# Patient Record
Sex: Male | Born: 1990 | Race: Black or African American | Hispanic: No | Smoking: Never smoker
Health system: Southern US, Community
[De-identification: ages and names within clinical notes are randomized; demographics above are authoritative.]

---

## 2015-10-04 ENCOUNTER — Encounter (HOSPITAL_COMMUNITY): Payer: Self-pay | Admitting: *Deleted

## 2015-10-04 ENCOUNTER — Emergency Department (HOSPITAL_COMMUNITY): Payer: Self-pay

## 2015-10-04 DIAGNOSIS — S93401A Sprain of unspecified ligament of right ankle, initial encounter: Secondary | ICD-10-CM | POA: Insufficient documentation

## 2015-10-04 DIAGNOSIS — Y929 Unspecified place or not applicable: Secondary | ICD-10-CM | POA: Insufficient documentation

## 2015-10-04 DIAGNOSIS — Y9367 Activity, basketball: Secondary | ICD-10-CM | POA: Insufficient documentation

## 2015-10-04 DIAGNOSIS — X58XXXA Exposure to other specified factors, initial encounter: Secondary | ICD-10-CM | POA: Insufficient documentation

## 2015-10-04 DIAGNOSIS — Y999 Unspecified external cause status: Secondary | ICD-10-CM | POA: Insufficient documentation

## 2015-10-04 NOTE — ED Notes (Signed)
Pt c/o pain to right ankle; pt states he was playing basketball x 1 week ago and rolled onto his ankle; pt was seen at hospital and diagnosed with a sprain; pt still c/o pain and swelling to right ankle

## 2015-10-05 ENCOUNTER — Emergency Department (HOSPITAL_COMMUNITY)
Admission: EM | Admit: 2015-10-05 | Discharge: 2015-10-05 | Disposition: A | Discharge status: Home / Self Care | Payer: Self-pay | Attending: Emergency Medicine | Admitting: Emergency Medicine

## 2015-10-05 DIAGNOSIS — S93401A Sprain of unspecified ligament of right ankle, initial encounter: Secondary | ICD-10-CM

## 2015-10-05 MED ORDER — DICLOFENAC SODIUM 50 MG PO TBEC: 50.0000 mg | DELAYED_RELEASE_TABLET | Freq: Two times a day (BID) | ORAL | Status: DC

## 2015-10-05 MED ORDER — HYDROCODONE-ACETAMINOPHEN 5-325 MG PO TABS
1.0000 | ORAL_TABLET | Freq: Once | ORAL | Status: AC
  Administered 2015-10-05: 1 via ORAL
  Filled 2015-10-05: qty 1

## 2015-10-05 MED ORDER — HYDROCODONE-ACETAMINOPHEN 5-325 MG PO TABS: 1.0000 | ORAL_TABLET | ORAL | Status: DC | PRN

## 2015-10-05 NOTE — ED Provider Notes (Signed)
CSN: [id number]     Arrival date & time 10/04/15  2313 History   First MD Initiated Contact with Patient 10/05/15 0117     Chief Complaint  Patient presents with  . Ankle Pain     (Consider location/radiation/quality/duration/timing/severity/associated sxs/prior Treatment) Patient is a 25 y.o. male presenting with ankle pain. The history is provided by the patient. No language interpreter was used.  Ankle Pain Location:  Ankle Time since incident:  1 week Injury: yes   Ankle location:  R ankle Pain details:    Quality:  Shooting   Radiates to:  Does not radiate   Severity:  Severe   Onset quality:  Sudden   Timing:  Constant   Progression:  Worsening Chronicity:  New Dislocation: no   Foreign body present:  No foreign bodies Prior injury to area:  No Relieved by:  Nothing  Daniel Waller is a 25 y.o. male who presents to the ED with right ankle pain that started a week ago when he was playing basketball and rolled his ankle. He was evaluated at a hospital in [place], [place] and told he had a sprain and to elevate it. Patient tried to work but stands all day and swelling and pain increased.   History reviewed. No pertinent past medical history. History reviewed. No pertinent past surgical history. History reviewed. No pertinent family history. Social History  Substance Use Topics  . Smoking status: Never Smoker   . Smokeless tobacco: None  . Alcohol Use: No    Review of Systems Negative except as stated in HPI   Allergies  Review of patient's allergies indicates no known allergies.  Home Medications   Prior to Admission medications   Medication Sig Start Date End Date Taking? Authorizing Provider  diclofenac (VOLTAREN) 50 MG EC tablet Take 1 tablet (50 mg total) by mouth 2 (two) times daily. 10/05/15   Jakhia Buxton Orlene Och, NP  HYDROcodone-acetaminophen (NORCO/VICODIN) 5-325 MG tablet Take 1 tablet by mouth every 4 (four) hours as needed. 10/05/15   Zacariah Belue Orlene Och, NP   BP  133/80 mmHg  Pulse 60  Temp(Src) 98.9 F (37.2 C) (Oral)  Resp 20  Ht  (1.905 m)  Wt 102.059 kg  BMI 28.12 kg/m2  SpO2 100% Physical Exam  Constitutional: He is oriented to person, place, and time. He appears well-developed and well-nourished. No distress.  HENT:  Head: Normocephalic.  Eyes: EOM are normal.  Neck: Normal range of motion. Neck supple.  Cardiovascular: Normal rate.   Pulmonary/Chest: Effort normal.  Musculoskeletal:       Right ankle: He exhibits decreased range of motion (due to pain), swelling and ecchymosis. He exhibits no deformity, no laceration and normal pulse. Tenderness. Lateral malleolus tenderness found. Achilles tendon normal.       Feet:  Pedal pulses 2+, adequate circulate circulation. Large amount of swelling to the lateral aspect of the right ankle.   Neurological: He is alert and oriented to person, place, and time. No cranial nerve deficit.  Skin: Skin is warm and dry.  Psychiatric: He has a normal mood and affect. His behavior is normal.  Nursing note and vitals reviewed.   ED Course  Procedures (including critical care time) Labs Review Labs Reviewed - No data to display  Imaging Review Dg Ankle Complete Right  10/05/2015  CLINICAL DATA:  25 year old male with right ankle injury. EXAM: RIGHT ANKLE - COMPLETE 3+ VIEW COMPARISON:  None. FINDINGS: There is no acute fracture or dislocation. The bones  are well mineralized. The ankle mortise is intact. A well corticated small bony fragment inferior to the medial malleolus likely represent an accessory ossicle. There is soft tissue swelling anterior the ankle joint. IMPRESSION: No acute fracture or dislocation. Electronically Signed   By: Elgie CollardArash  Radparvar M.D.   On: 10/05/2015 00:30    MDM  25 y.o. male with pain and swelling to the right ankle s/p injury one week ago. Stable for d/c without fracture or dislocation on x-ray. No focal neuro deficits. Air cast applied, ice, elevation, crutches and  f/u with ortho.   Final diagnoses:  Ankle sprain, right, initial encounter      [facility] M Ruberta Holck, NP 10/05/15 0140  Shon Batonourtney F Horton, MD 10/05/15 [phone]

## 2017-01-27 ENCOUNTER — Emergency Department (HOSPITAL_COMMUNITY): Payer: Self-pay

## 2017-01-27 ENCOUNTER — Emergency Department (HOSPITAL_COMMUNITY)
Admission: EM | Admit: 2017-01-27 | Discharge: 2017-01-27 | Disposition: A | Discharge status: Home / Self Care | Payer: Self-pay | Attending: Emergency Medicine | Admitting: Emergency Medicine

## 2017-01-27 ENCOUNTER — Encounter (HOSPITAL_COMMUNITY): Payer: Self-pay | Admitting: Emergency Medicine

## 2017-01-27 DIAGNOSIS — S82832A Other fracture of upper and lower end of left fibula, initial encounter for closed fracture: Secondary | ICD-10-CM | POA: Insufficient documentation

## 2017-01-27 DIAGNOSIS — X509XXA Other and unspecified overexertion or strenuous movements or postures, initial encounter: Secondary | ICD-10-CM | POA: Insufficient documentation

## 2017-01-27 DIAGNOSIS — Y92009 Unspecified place in unspecified non-institutional (private) residence as the place of occurrence of the external cause: Secondary | ICD-10-CM | POA: Insufficient documentation

## 2017-01-27 DIAGNOSIS — Y999 Unspecified external cause status: Secondary | ICD-10-CM | POA: Insufficient documentation

## 2017-01-27 DIAGNOSIS — Y9367 Activity, basketball: Secondary | ICD-10-CM | POA: Insufficient documentation

## 2017-01-27 DIAGNOSIS — Z79899 Other long term (current) drug therapy: Secondary | ICD-10-CM | POA: Insufficient documentation

## 2017-01-27 DIAGNOSIS — S93402A Sprain of unspecified ligament of left ankle, initial encounter: Secondary | ICD-10-CM | POA: Insufficient documentation

## 2017-01-27 MED ORDER — IBUPROFEN 800 MG PO TABS: 800.0000 mg | ORAL_TABLET | Freq: Three times a day (TID) | ORAL | 0 refills | Status: AC

## 2017-01-27 NOTE — ED Triage Notes (Signed)
Pt c/o left ankle pain after landing wrong playing basketball today.

## 2017-01-27 NOTE — ED Provider Notes (Signed)
  [facility]-EMERGENCY DEPT Provider Note   CSN: [id number] Arrival date & time: 01/27/17  2225     History   Chief Complaint Chief Complaint  Patient presents with  . Ankle Pain    HPI Daniel Waller is a 26 y.o. male.  The history is provided by the patient. No language interpreter was used.  Ankle Pain   The incident occurred 6 to 12 hours ago. The incident occurred at home. There was no injury mechanism. The pain is present in the left ankle. The pain is moderate. The pain has been constant since onset. Nothing aggravates the symptoms. He has tried nothing for the symptoms.    History reviewed. No pertinent past medical history.  There are no active problems to display for this patient.   History reviewed. No pertinent surgical history.     Home Medications    Prior to Admission medications   Medication Sig Start Date End Date Taking? Authorizing Provider  diclofenac (VOLTAREN) 50 MG EC tablet Take 1 tablet (50 mg total) by mouth 2 (two) times daily. 10/05/15   Janne NapoleonNeese, Hope M, NP  HYDROcodone-acetaminophen (NORCO/VICODIN) 5-325 MG tablet Take 1 tablet by mouth every 4 (four) hours as needed. 10/05/15   Janne NapoleonNeese, Hope M, NP    Family History No family history on file.  Social History Social History  Substance Use Topics  . Smoking status: Never Smoker  . Smokeless tobacco: Never Used  . Alcohol use No     Allergies   Patient has no known allergies.   Review of Systems Review of Systems  All other systems reviewed and are negative.    Physical Exam Updated Vital Signs BP (!) 141/77   Pulse 62   Temp 98 F (36.7 C)   Resp 18   Ht 6\' 3"  (1.905 m)   Wt 106.6 kg (235 lb)   SpO2 97%   BMI 29.37 kg/m   Physical Exam  Constitutional: He appears well-developed and well-nourished.  HENT:  Head: Normocephalic.  Musculoskeletal: He exhibits edema and tenderness.  Neurological: He is alert.  Skin: Skin is warm.  Psychiatric: He has a normal mood and  affect.  Nursing note and vitals reviewed.    ED Treatments / Results  Labs (all labs ordered are listed, but only abnormal results are displayed) Labs Reviewed - No data to display  EKG  EKG Interpretation None       Radiology No results found.  Procedures Procedures (including critical care time)  Medications Ordered in ED Medications - No data to display   Initial Impression / Assessment and Plan / ED Course  I have reviewed the triage vital signs and the nursing notes.  Pertinent labs & imaging results that were available during my care of the patient were reviewed by me and considered in my medical decision making (see chart for details).  Clinical Course as of Jan 27 2300  Sat Jan 27, 2017  2247 DG Ankle Complete Left [LS]    Clinical Course User Index [LS] Elson AreasSofia, Sargon Scouten K, [place]PA-C      Final Clinical Impressions(s) / ED Diagnoses   Final diagnoses:  Sprain of left ankle, unspecified ligament, initial encounter    New Prescriptions New Prescriptions   No medications on file   Cam walker/crutches Follow up with Dr. Romeo AppleHarrison for evaluation An After Visit Summary was printed and given to the patient.   Osie CheeksSofia, Quinlee Sciarra K, PA-C 01/27/17 2315    Benjiman CorePickering, Nathan, MD 01/28/17 [phone]

## 2017-01-27 NOTE — Discharge Instructions (Signed)
Return if any problems.

## 2017-12-15 ENCOUNTER — Encounter (HOSPITAL_BASED_OUTPATIENT_CLINIC_OR_DEPARTMENT_OTHER): Payer: Self-pay | Admitting: Emergency Medicine

## 2017-12-15 ENCOUNTER — Emergency Department (HOSPITAL_BASED_OUTPATIENT_CLINIC_OR_DEPARTMENT_OTHER)
Admission: EM | Admit: 2017-12-15 | Discharge: 2017-12-15 | Disposition: A | Discharge status: Home / Self Care | Payer: Self-pay | Attending: Emergency Medicine | Admitting: Emergency Medicine

## 2017-12-15 ENCOUNTER — Emergency Department (HOSPITAL_BASED_OUTPATIENT_CLINIC_OR_DEPARTMENT_OTHER): Payer: Self-pay

## 2017-12-15 ENCOUNTER — Other Ambulatory Visit: Payer: Self-pay

## 2017-12-15 DIAGNOSIS — K5900 Constipation, unspecified: Secondary | ICD-10-CM | POA: Insufficient documentation

## 2017-12-15 DIAGNOSIS — R1033 Periumbilical pain: Secondary | ICD-10-CM | POA: Insufficient documentation

## 2017-12-15 LAB — CBC WITH DIFFERENTIAL/PLATELET
BASOS PCT: 0 %
Basophils Absolute: 0 10*3/uL (ref 0.0–0.1)
EOS ABS: 0.1 10*3/uL (ref 0.0–0.7)
EOS PCT: 2 %
HCT: 40.8 % (ref 39.0–52.0)
Hemoglobin: 14.5 g/dL (ref 13.0–17.0)
LYMPHS ABS: 1.8 10*3/uL (ref 0.7–4.0)
Lymphocytes Relative: 59 %
MCH: 31 pg (ref 26.0–34.0)
MCHC: 35.5 g/dL (ref 30.0–36.0)
MCV: 87.4 fL (ref 78.0–100.0)
MONO ABS: 0.3 10*3/uL (ref 0.1–1.0)
Monocytes Relative: 9 %
NEUTROS ABS: 0.9 10*3/uL — AB (ref 1.7–7.7)
NEUTROS PCT: 30 %
PLATELETS: 219 10*3/uL (ref 150–400)
RBC: 4.67 MIL/uL (ref 4.22–5.81)
RDW: 12.8 % (ref 11.5–15.5)
WBC: 3.1 10*3/uL — ABNORMAL LOW (ref 4.0–10.5)

## 2017-12-15 LAB — COMPREHENSIVE METABOLIC PANEL
ALK PHOS: 54 U/L (ref 38–126)
ALT: 22 U/L (ref 17–63)
AST: 23 U/L (ref 15–41)
Albumin: 4.2 g/dL (ref 3.5–5.0)
Anion gap: 3 — ABNORMAL LOW (ref 5–15)
BUN: 13 mg/dL (ref 6–20)
CALCIUM: 8.7 mg/dL — AB (ref 8.9–10.3)
CO2: 29 mmol/L (ref 22–32)
CREATININE: 1.08 mg/dL (ref 0.61–1.24)
Chloride: 107 mmol/L (ref 101–111)
GFR calc non Af Amer: >60 mL/min (ref >60)
GLUCOSE: 93 mg/dL (ref 65–99)
Potassium: 3.7 mmol/L (ref 3.5–5.1)
SODIUM: 139 mmol/L (ref 135–145)
Total Bilirubin: 0.7 mg/dL (ref 0.3–1.2)
Total Protein: 7.5 g/dL (ref 6.5–8.1)

## 2017-12-15 MED ORDER — POLYETHYLENE GLYCOL 3350 17 G PO PACK: 17.0000 g | PACK | Freq: Every day | ORAL | 0 refills | Status: AC

## 2017-12-15 NOTE — Discharge Instructions (Addendum)
Your x-ray showed that you had a large amount of stool in your colon.  Please take the following medications to help with constipation.

## 2017-12-15 NOTE — ED Provider Notes (Signed)
[facility] EMERGENCY DEPARTMENT Provider Note   CSN: [id number] Arrival date & time: 12/15/17  1140     History   Chief Complaint Chief Complaint  Patient presents with  . Abdominal Pain    HPI Daniel Waller is a 27 y.o. male who presents to ED for evaluation of several month history of intermittent abdominal pain.  States that the pain is located above his umbilicus.  The pain comes and goes with no aggravating or alleviating factors.  Describes the pain as aching.  He feels a knot in his stomach.  States that he lifted something heavy yesterday at work which cause worsening of his pain.  He denies pain currently.  He has not tried any medications to help with his symptoms.  Denies any associated nausea, vomiting, diarrhea, constipation, blood in stool, fever, urinary symptoms, hematuria.  Denies any prior abdominal surgeries.  Denies any alcohol, tobacco or other drug use.  HPI  History reviewed. No pertinent past medical history.  There are no active problems to display for this patient.   History reviewed. No pertinent surgical history.      Home Medications    Prior to Admission medications   Medication Sig Start Date End Date Taking? Authorizing Provider  ibuprofen (ADVIL,MOTRIN) 800 MG tablet Take 1 tablet (800 mg total) by mouth 3 (three) times daily. 01/27/17   Elson Areas, PA-C  polyethylene glycol (MIRALAX / GLYCOLAX) packet Take 17 g by mouth daily. 12/15/17   Dietrich Pates, PA-C    Family History No family history on file.  Social History Social History   Tobacco Use  . Smoking status: Never Smoker  . Smokeless tobacco: Never Used  Substance Use Topics  . Alcohol use: No  . Drug use: No     Allergies   Patient has no known allergies.   Review of Systems Review of Systems  Constitutional: Negative for appetite change, chills and fever.  HENT: Negative for ear pain, rhinorrhea, sneezing and sore throat.   Eyes: Negative for  photophobia and visual disturbance.  Respiratory: Negative for cough, chest tightness, shortness of breath and wheezing.   Cardiovascular: Negative for chest pain and palpitations.  Gastrointestinal: Positive for abdominal pain. Negative for blood in stool, constipation, diarrhea, nausea and vomiting.  Genitourinary: Negative for dysuria, hematuria and urgency.  Musculoskeletal: Negative for myalgias.  Skin: Negative for rash.  Neurological: Negative for dizziness, weakness and light-headedness.     Physical Exam Updated Vital Signs BP (!) 142/92 (BP Location: Left Arm)   Pulse (!) 53   Temp 98.6 F (37 C) (Oral)   Resp 16   Ht 6\' 3"  (1.905 m)   Wt 108.9 kg (240 lb)   SpO2 100%   BMI 30.00 kg/m   Physical Exam  Constitutional: He appears well-developed and well-nourished. No distress.  HENT:  Head: Normocephalic and atraumatic.  Nose: Nose normal.  Eyes: Conjunctivae and EOM are normal. Left eye exhibits no discharge. No scleral icterus.  Neck: Normal range of motion. Neck supple.  Cardiovascular: Normal rate, regular rhythm, normal heart sounds and intact distal pulses. Exam reveals no gallop and no friction rub.  No murmur heard. Pulmonary/Chest: Effort normal and breath sounds normal. No respiratory distress.  Abdominal: Soft. Bowel sounds are normal. He exhibits no distension. There is no tenderness. There is no guarding. No hernia.  No abdominal tenderness to palpation.  No hernia palpated.  Musculoskeletal: Normal range of motion. He exhibits no edema.  Neurological: He is  alert. He exhibits normal muscle tone. Coordination normal.  Skin: Skin is warm and dry. No rash noted.  Psychiatric: He has a normal mood and affect.  Nursing note and vitals reviewed.    ED Treatments / Results  Labs (all labs ordered are listed, but only abnormal results are displayed) Labs Reviewed  COMPREHENSIVE METABOLIC PANEL - Abnormal; Notable for the following components:      Result  Value   Calcium 8.7 (*)    Anion gap 3 (*)    All other components within normal limits  CBC WITH DIFFERENTIAL/PLATELET - Abnormal; Notable for the following components:   WBC 3.1 (*)    Neutro Abs 0.9 (*)    All other components within normal limits    EKG None  Radiology Dg Abdomen 1 View  Result Date: 12/15/2017 CLINICAL DATA:  Abdominal pain for approximately 2 months EXAM: ABDOMEN - 1 VIEW COMPARISON:  None. FINDINGS: There is fairly diffuse stool throughout colon. There is no bowel dilatation or air-fluid level to suggest bowel obstruction. No free air. There is an apparent phlebolith in the lateral left pelvis. Lung bases are clear. IMPRESSION: Fairly diffuse stool throughout colon. No evident bowel obstruction or free air. Lung bases clear. Electronically Signed   By: Bretta BangWilliam  Woodruff III M.D.   On: 12/15/2017 13:15    Procedures Procedures (including critical care time)  Medications Ordered in ED Medications - No data to display   Initial Impression / Assessment and Plan / ED Course  I have reviewed the triage vital signs and the nursing notes.  Pertinent labs & imaging results that were available during my care of the patient were reviewed by me and considered in my medical decision making (see chart for details).     [age] year old male with no significant past medical history presents for evaluation of several month history of intermittent abdominal pain.  States that he feels a knot above his umbilicus.  The pain comes and goes with no aggravating or alleviating factors.  Lifted something heavy yesterday at work which worsened the pain.  Denies pain currently during my examination.  On physical exam he is overall well-appearing.  There is no hernia or mass palpated in the abdomen.  He has no tenderness to palpation noted.  He denies any nausea, vomiting, bowel changes, urinary symptoms, fever.  CBC, CMP unremarkable.  Abdominal x-ray shows diffuse amount of stool within the  colon with no acute abnormality.  No evidence of bowel obstruction or free air.  Suspect that this could be the cause of his discomfort.  Educated patient on increasing fiber in his diet and to take MiraLAX as needed for constipation.  Advised to return to ED for any severe worsening symptoms.  Portions of this note were generated with Scientist, clinical (histocompatibility and immunogenetics)Dragon dictation software. Dictation errors may occur despite best attempts at proofreading.   Final Clinical Impressions(s) / ED Diagnoses   Final diagnoses:  Constipation, unspecified constipation type    ED Discharge Orders        Ordered    polyethylene glycol (MIRALAX / GLYCOLAX) packet  Daily     12/15/17 1348       Dietrich PatesKhatri, Raissa Dam, PA-C 12/15/17 1349    Linwood DibblesKnapp, Jon, MD 12/15/17 [phone]

## 2017-12-15 NOTE — ED Notes (Signed)
Patient transported to X-ray 

## 2017-12-15 NOTE — ED Triage Notes (Addendum)
Pt reports intermittent abd pain above the naval for several months. Denies pain presently.

## 2017-12-17 LAB — PATHOLOGIST SMEAR REVIEW

## 2018-06-09 ENCOUNTER — Encounter (HOSPITAL_BASED_OUTPATIENT_CLINIC_OR_DEPARTMENT_OTHER): Payer: Self-pay | Admitting: *Deleted

## 2018-06-09 ENCOUNTER — Other Ambulatory Visit: Payer: Self-pay

## 2018-06-09 ENCOUNTER — Emergency Department (HOSPITAL_BASED_OUTPATIENT_CLINIC_OR_DEPARTMENT_OTHER)
Admission: EM | Admit: 2018-06-09 | Discharge: 2018-06-09 | Disposition: A | Discharge status: Home / Self Care | Payer: Self-pay | Attending: Emergency Medicine | Admitting: Emergency Medicine

## 2018-06-09 DIAGNOSIS — S39012A Strain of muscle, fascia and tendon of lower back, initial encounter: Secondary | ICD-10-CM | POA: Insufficient documentation

## 2018-06-09 DIAGNOSIS — Z79899 Other long term (current) drug therapy: Secondary | ICD-10-CM | POA: Insufficient documentation

## 2018-06-09 DIAGNOSIS — Y998 Other external cause status: Secondary | ICD-10-CM | POA: Insufficient documentation

## 2018-06-09 DIAGNOSIS — Y9389 Activity, other specified: Secondary | ICD-10-CM | POA: Insufficient documentation

## 2018-06-09 DIAGNOSIS — Y92410 Unspecified street and highway as the place of occurrence of the external cause: Secondary | ICD-10-CM | POA: Insufficient documentation

## 2018-06-09 MED ORDER — CYCLOBENZAPRINE HCL 5 MG PO TABS: 5.0000 mg | ORAL_TABLET | Freq: Two times a day (BID) | ORAL | 0 refills | Status: AC | PRN

## 2018-06-09 MED ORDER — CYCLOBENZAPRINE HCL 5 MG PO TABS
5.0000 mg | ORAL_TABLET | Freq: Once | ORAL | Status: AC
  Administered 2018-06-09: 5 mg via ORAL
  Filled 2018-06-09: qty 1

## 2018-06-09 MED ORDER — NAPROXEN 250 MG PO TABS
500.0000 mg | ORAL_TABLET | Freq: Once | ORAL | Status: AC
Start: 2018-06-09 — End: 2018-06-09
  Administered 2018-06-09: 500 mg via ORAL
  Filled 2018-06-09: qty 2

## 2018-06-09 MED ORDER — NAPROXEN 500 MG PO TABS: 500.0000 mg | ORAL_TABLET | Freq: Two times a day (BID) | ORAL | 0 refills | Status: DC
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
## 2019-05-13 ENCOUNTER — Encounter (HOSPITAL_BASED_OUTPATIENT_CLINIC_OR_DEPARTMENT_OTHER): Payer: Self-pay | Admitting: *Deleted

## 2019-05-13 ENCOUNTER — Emergency Department (HOSPITAL_BASED_OUTPATIENT_CLINIC_OR_DEPARTMENT_OTHER)
Admission: EM | Admit: 2019-05-13 | Discharge: 2019-05-13 | Disposition: A | Discharge status: Home / Self Care | Payer: Self-pay | Attending: Emergency Medicine | Admitting: Emergency Medicine

## 2019-05-13 ENCOUNTER — Other Ambulatory Visit: Payer: Self-pay

## 2019-05-13 DIAGNOSIS — Z20828 Contact with and (suspected) exposure to other viral communicable diseases: Secondary | ICD-10-CM | POA: Insufficient documentation

## 2019-05-13 DIAGNOSIS — Z79899 Other long term (current) drug therapy: Secondary | ICD-10-CM | POA: Insufficient documentation

## 2019-05-13 DIAGNOSIS — Z20822 Contact with and (suspected) exposure to covid-19: Secondary | ICD-10-CM

## 2019-05-13 DIAGNOSIS — R109 Unspecified abdominal pain: Secondary | ICD-10-CM | POA: Insufficient documentation

## 2019-05-13 NOTE — Discharge Instructions (Signed)
Self isolate at home until you hear back about your test.

## 2019-05-13 NOTE — ED Triage Notes (Signed)
Loss of taste and abdominal yesterday. A coworker tested positive for Covid.

## 2019-05-13 NOTE — ED Provider Notes (Signed)
[facility] EMERGENCY DEPARTMENT Provider Note   CSN: [id number] Arrival date & time: 05/13/19  1517     History   Chief Complaint Chief Complaint  Patient presents with  . Abdominal Pain    HPI Daniel Waller is a 28 y.o. male.     Patient here for coronavirus test.  Had close contact at work.  Has some loss of taste and smell yesterday may be some vague abdominal pain yesterday.  Overall asymptomatic today.  Denies any respiratory symptoms.  No active abdominal pain.  No shortness of breath, no chest pain.   Illness Severity:  Mild Onset quality:  Gradual Timing:  Unable to specify Progression:  Unable to specify Chronicity:  New Associated symptoms: abdominal pain   Associated symptoms: no chest pain, no cough, no ear pain, no fever, no rash, no shortness of breath, no sore throat and no vomiting     History reviewed. No pertinent past medical history.  There are no active problems to display for this patient.   History reviewed. No pertinent surgical history.      Home Medications    Prior to Admission medications   Medication Sig Start Date End Date Taking? Authorizing Provider  cyclobenzaprine (FLEXERIL) 5 MG tablet Take 1 tablet (5 mg total) by mouth 2 (two) times daily as needed. 06/09/18   Horton, Mayer Masker, MD  ibuprofen (ADVIL,MOTRIN) 800 MG tablet Take 1 tablet (800 mg total) by mouth 3 (three) times daily. 01/27/17   Elson Areas, PA-C  naproxen (NAPROSYN) 500 MG tablet Take 1 tablet (500 mg total) by mouth 2 (two) times daily. 06/09/18   Horton, Mayer Masker, MD  polyethylene glycol (MIRALAX / GLYCOLAX) packet Take 17 g by mouth daily. 12/15/17   Dietrich Pates, PA-C    Family History No family history on file.  Social History Social History   Tobacco Use  . Smoking status: Never Smoker  . Smokeless tobacco: Never Used  Substance Use Topics  . Alcohol use: Yes  . Drug use: No     Allergies   Patient has no known allergies.    Review of Systems Review of Systems  Constitutional: Negative for chills and fever.  HENT: Negative for ear pain and sore throat.   Eyes: Negative for pain and visual disturbance.  Respiratory: Negative for cough and shortness of breath.   Cardiovascular: Negative for chest pain and palpitations.  Gastrointestinal: Positive for abdominal pain. Negative for vomiting.  Genitourinary: Negative for dysuria and hematuria.  Musculoskeletal: Negative for arthralgias and back pain.  Skin: Negative for color change and rash.  Neurological: Negative for seizures and syncope.  All other systems reviewed and are negative.    Physical Exam Updated Vital Signs BP (!) 136/91   Pulse 72   Temp 99.2 F (37.3 C) (Oral)   Resp 16   Ht 6\' 3"  (1.905 m)   Wt 113.4 kg   SpO2 100%   BMI 31.25 kg/m   Physical Exam Vitals signs and nursing note reviewed.  Constitutional:      General: He is not in acute distress.    Appearance: He is well-developed. He is not ill-appearing.  HENT:     Head: Normocephalic and atraumatic.  Eyes:     Extraocular Movements: Extraocular movements intact.     Conjunctiva/sclera: Conjunctivae normal.     Pupils: Pupils are equal, round, and reactive to light.  Neck:     Musculoskeletal: Neck supple.  Cardiovascular:  Rate and Rhythm: Normal rate and regular rhythm.     Heart sounds: Normal heart sounds. No murmur.  Pulmonary:     Effort: Pulmonary effort is normal. No respiratory distress.     Breath sounds: Normal breath sounds.  Abdominal:     General: Bowel sounds are normal.     Palpations: Abdomen is soft.     Tenderness: There is no abdominal tenderness. There is no right CVA tenderness, left CVA tenderness, guarding or rebound. Negative signs include Murphy's sign, Rovsing's sign, McBurney's sign and psoas sign.  Skin:    General: Skin is warm and dry.     Capillary Refill: Capillary refill takes less than 2 seconds.  Neurological:     Mental  Status: He is alert.      ED Treatments / Results  Labs (all labs ordered are listed, but only abnormal results are displayed) Labs Reviewed  NOVEL CORONAVIRUS, NAA (HOSP ORDER, SEND-OUT TO REF LAB; TAT 18-24 HRS)    EKG None  Radiology No results found.  Procedures Procedures (including critical care time)  Medications Ordered in ED Medications - No data to display   Initial Impression / Assessment and Plan / ED Course  I have reviewed the triage vital signs and the nursing notes.  Pertinent labs & imaging results that were available during my care of the patient were reviewed by me and considered in my medical decision making (see chart for details).        Daniel Waller is a 28 year old male with no significant medical history presents to the ED with close exposure to patient with coronavirus.  Here for Covid test.  Patient with overall unremarkable vitals.  Has some loss of taste yesterday, had some abdominal pain yesterday but overall asymptomatic today.  No respiratory symptoms.  Clear breath sounds.  No signs of respiratory distress.  No abdominal tenderness on exam.  We will get a Covid swab.  Given information for self-isolation at home.  Given return precautions and discharged in ED in good condition.  Daniel Waller was evaluated in Emergency Department on 05/13/2019 for the symptoms described in the history of present illness. He was evaluated in the context of the global COVID-19 pandemic, which necessitated consideration that the patient might be at risk for infection with the SARS-CoV-2 virus that causes COVID-19. Institutional protocols and algorithms that pertain to the evaluation of patients at risk for COVID-19 are in a state of rapid change based on information released by regulatory bodies including the CDC and federal and state organizations. These policies and algorithms were followed during the patient's care in the ED.  This chart was dictated using  voice recognition software.  Despite best efforts to proofread,  errors can occur which can change the documentation meaning.    Final Clinical Impressions(s) / ED Diagnoses   Final diagnoses:  Close exposure to COVID-19 virus    ED Discharge Orders    None       Lennice Sites, DO 05/13/19 1706

## 2019-05-16 ENCOUNTER — Telehealth (HOSPITAL_BASED_OUTPATIENT_CLINIC_OR_DEPARTMENT_OTHER): Payer: Self-pay | Admitting: Emergency Medicine

## 2019-05-16 LAB — NOVEL CORONAVIRUS, NAA (HOSP ORDER, SEND-OUT TO REF LAB; TAT 18-24 HRS)

## 2019-05-18 LAB — NOVEL CORONAVIRUS, NAA (HOSP ORDER, SEND-OUT TO REF LAB; TAT 18-24 HRS): SARS-CoV-2, NAA: NOT DETECTED

## 2019-06-25 ENCOUNTER — Emergency Department (HOSPITAL_BASED_OUTPATIENT_CLINIC_OR_DEPARTMENT_OTHER): Payer: Self-pay

## 2019-06-25 ENCOUNTER — Emergency Department (HOSPITAL_BASED_OUTPATIENT_CLINIC_OR_DEPARTMENT_OTHER)
Admission: EM | Admit: 2019-06-25 | Discharge: 2019-06-25 | Disposition: A | Discharge status: Home / Self Care | Payer: Self-pay | Attending: Emergency Medicine | Admitting: Emergency Medicine

## 2019-06-25 ENCOUNTER — Encounter (HOSPITAL_BASED_OUTPATIENT_CLINIC_OR_DEPARTMENT_OTHER): Payer: Self-pay

## 2019-06-25 ENCOUNTER — Other Ambulatory Visit: Payer: Self-pay

## 2019-06-25 DIAGNOSIS — Y9289 Other specified places as the place of occurrence of the external cause: Secondary | ICD-10-CM | POA: Insufficient documentation

## 2019-06-25 DIAGNOSIS — Y9301 Activity, walking, marching and hiking: Secondary | ICD-10-CM | POA: Insufficient documentation

## 2019-06-25 DIAGNOSIS — W1842XA Slipping, tripping and stumbling without falling due to stepping into hole or opening, initial encounter: Secondary | ICD-10-CM | POA: Insufficient documentation

## 2019-06-25 DIAGNOSIS — S93402A Sprain of unspecified ligament of left ankle, initial encounter: Secondary | ICD-10-CM | POA: Insufficient documentation

## 2019-06-25 DIAGNOSIS — Y990 Civilian activity done for income or pay: Secondary | ICD-10-CM | POA: Insufficient documentation

## 2019-06-25 MED ORDER — NAPROXEN 500 MG PO TABS: 500.0000 mg | ORAL_TABLET | Freq: Two times a day (BID) | ORAL | 0 refills | Status: AC

## 2019-06-25 MED ORDER — NAPROXEN 500 MG PO TABS: 500.0000 mg | ORAL_TABLET | Freq: Two times a day (BID) | ORAL | 0 refills | Status: DC

## 2019-06-25 NOTE — ED Provider Notes (Signed)
[facility] EMERGENCY DEPARTMENT Provider Note   CSN: [id number] Arrival date & time: 06/25/19  2020     History Chief Complaint  Patient presents with  . Ankle Injury    Daniel Waller is a 28 y.o. male.  Patient is a 28 year old male with no significant medical known for injury to left ankle.  Patient reports that he accidentally stepped in a hole on Monday and inverted his left ankle.  Since then has had pain and swelling.  He has been able to ambulate with a limp.  Has been icing it.  Also has been wearing a brace.  Reports previous fracture to this ankle before.  No other injuries.  Did not hit his head or pass out.        History reviewed. No pertinent past medical history.  There are no problems to display for this patient.   History reviewed. No pertinent surgical history.     No family history on file.  Social History   Tobacco Use  . Smoking status: Never Smoker  . Smokeless tobacco: Never Used  Substance Use Topics  . Alcohol use: Yes    Comment: occ  . Drug use: No    Home Medications Prior to Admission medications   Medication Sig Start Date End Date Taking? Authorizing Provider  cyclobenzaprine (FLEXERIL) 5 MG tablet Take 1 tablet (5 mg total) by mouth 2 (two) times daily as needed. 06/09/18   Horton, Barbette Hair, MD  ibuprofen (ADVIL,MOTRIN) 800 MG tablet Take 1 tablet (800 mg total) by mouth 3 (three) times daily. 01/27/17   Fransico Meadow, PA-C  naproxen (NAPROSYN) 500 MG tablet Take 1 tablet (500 mg total) by mouth 2 (two) times daily for 7 days. 06/25/19 07/02/19  Madilyn Hook A, PA-C  polyethylene glycol (MIRALAX / GLYCOLAX) packet Take 17 g by mouth daily. 12/15/17   Delia Heady, PA-C    Allergies    Patient has no known allergies.  Review of Systems   Review of Systems  Constitutional: Negative for fever.  Musculoskeletal: Positive for arthralgias and joint swelling. Negative for back pain, myalgias, neck pain and neck  stiffness.  Skin: Negative for wound.  All other systems reviewed and are negative.   Physical Exam Updated Vital Signs BP 134/89 (BP Location: Left Arm)   Pulse 60   Temp 98.8 F (37.1 C) (Oral)   Resp 18   Ht 6\' 3"  (1.905 m)   Wt 113.4 kg   SpO2 99%   BMI 31.25 kg/m   Physical Exam Vitals and nursing note reviewed.  Constitutional:      General: He is not in acute distress.    Appearance: Normal appearance. He is not ill-appearing, toxic-appearing or diaphoretic.  HENT:     Head: Normocephalic.  Eyes:     Conjunctiva/sclera: Conjunctivae normal.  Pulmonary:     Effort: Pulmonary effort is normal.  Musculoskeletal:     Right ankle: Normal.     Right Achilles Tendon: Normal.     Left ankle: Swelling present. No deformity, ecchymosis or lacerations. Tenderness present over the lateral malleolus and ATF ligament. Normal range of motion.     Left Achilles Tendon: Normal.  Skin:    General: Skin is warm and dry.     Capillary Refill: Capillary refill takes less than 2 seconds.     Findings: No bruising.  Neurological:     Mental Status: He is alert.     Sensory: No sensory deficit.  Motor: No weakness.     Gait: Gait abnormal.  Psychiatric:        Mood and Affect: Mood normal.     ED Results / Procedures / Treatments   Labs (all labs ordered are listed, but only abnormal results are displayed) Labs Reviewed - No data to display  EKG None  Radiology DG Ankle Complete Left  Result Date: 06/25/2019 CLINICAL DATA:  Fall, stepped in hole 2 days prior, injured left ankle EXAM: LEFT ANKLE COMPLETE - 3+ VIEW COMPARISON:  Ankle radiograph 01/27/2017 FINDINGS: No acute fracture. Corticated mineralization is adjacent the tip of the medial and lateral malleoli likely reflect remote avulsion type fractures related to injury seen on comparison radiograph 01/27/2017. There is a moderate joint effusion and circumferential swelling of the ankle. The ankle mortise remains  congruent. No suspicious osseous lesion. Mid and hindfoot alignment is grossly preserved on this nondedicated, nonweightbearing radiograph. IMPRESSION: 1. No acute fracture or dislocation. 2. Moderate joint effusion and soft tissue swelling. 3. Corticated mineralization adjacent to the tip of the medial and lateral malleoli likely reflect remote avulsion type fractures. Electronically Signed   By: Kreg Shropshire M.D.   On: 06/25/2019 20:53    Procedures Procedures (including critical care time)  Medications Ordered in ED Medications - No data to display  ED Course  I have reviewed the triage vital signs and the nursing notes.  Pertinent labs & imaging results that were available during my care of the patient were reviewed by me and considered in my medical decision making (see chart for details).    MDM Rules/Calculators/A&P                      Based on review of vitals, medical screening exam, lab work and/or imaging, there does not appear to be an acute, emergent etiology for the patient's symptoms. Counseled pt on good return precautions and encouraged both PCP and ED follow-up as needed.  Prior to discharge, I also discussed incidental imaging findings with patient in detail and advised appropriate, recommended follow-up in detail.  Clinical Impression: 1. Sprain of left ankle, unspecified ligament, initial encounter     Disposition: Discharge  Prior to providing a prescription for a controlled substance, I independently reviewed the patient's recent prescription history on the [place] Controlled Substance Reporting System. The patient had no recent or regular prescriptions and was deemed appropriate for a brief, less than 3 day prescription of narcotic for acute analgesia.  This note was prepared with assistance of Conservation officer, historic buildings. Occasional wrong-word or sound-a-like substitutions may have occurred due to the inherent limitations of voice recognition  software.  Final Clinical Impression(s) / ED Diagnoses Final diagnoses:  Sprain of left ankle, unspecified ligament, initial encounter    Rx / DC Orders ED Discharge Orders         Ordered    naproxen (NAPROSYN) 500 MG tablet  2 times daily     06/25/19 2105           Jeral Pinch 06/25/19 2108    Geoffery Lyons, MD 06/25/19 2121

## 2019-06-25 NOTE — ED Triage Notes (Signed)
Pt states he stepped in hole at work 2 days ago and injured left ankle-NAD-steady gait

## 2019-07-06 ENCOUNTER — Other Ambulatory Visit: Payer: Self-pay

## 2019-07-06 ENCOUNTER — Encounter (HOSPITAL_BASED_OUTPATIENT_CLINIC_OR_DEPARTMENT_OTHER): Payer: Self-pay | Admitting: *Deleted

## 2019-07-06 ENCOUNTER — Emergency Department (HOSPITAL_BASED_OUTPATIENT_CLINIC_OR_DEPARTMENT_OTHER): Payer: Self-pay

## 2019-07-06 ENCOUNTER — Emergency Department (HOSPITAL_BASED_OUTPATIENT_CLINIC_OR_DEPARTMENT_OTHER)
Admission: EM | Admit: 2019-07-06 | Discharge: 2019-07-06 | Disposition: A | Discharge status: Home / Self Care | Payer: Self-pay | Attending: Emergency Medicine | Admitting: Emergency Medicine

## 2019-07-06 DIAGNOSIS — R1031 Right lower quadrant pain: Secondary | ICD-10-CM | POA: Insufficient documentation

## 2019-07-06 DIAGNOSIS — R110 Nausea: Secondary | ICD-10-CM | POA: Insufficient documentation

## 2019-07-06 DIAGNOSIS — R109 Unspecified abdominal pain: Secondary | ICD-10-CM

## 2019-07-06 DIAGNOSIS — K5909 Other constipation: Secondary | ICD-10-CM

## 2019-07-06 LAB — URINALYSIS, ROUTINE W REFLEX MICROSCOPIC
Bilirubin Urine: NEGATIVE
Glucose, UA: NEGATIVE mg/dL
Ketones, ur: NEGATIVE mg/dL
Leukocytes,Ua: NEGATIVE
Nitrite: NEGATIVE
Protein, ur: NEGATIVE mg/dL
Specific Gravity, Urine: >1.03 — ABNORMAL HIGH (ref 1.005–1.030)
pH: 6 (ref 5.0–8.0)

## 2019-07-06 LAB — COMPREHENSIVE METABOLIC PANEL
ALT: 24 U/L (ref 0–44)
AST: 21 U/L (ref 15–41)
Albumin: 4 g/dL (ref 3.5–5.0)
Alkaline Phosphatase: 48 U/L (ref 38–126)
Anion gap: 10 (ref 5–15)
BUN: 21 mg/dL — ABNORMAL HIGH (ref 6–20)
CO2: 26 mmol/L (ref 22–32)
Calcium: 9.3 mg/dL (ref 8.9–10.3)
Chloride: 105 mmol/L (ref 98–111)
Creatinine, Ser: 1.15 mg/dL (ref 0.61–1.24)
GFR calc Af Amer: >60 mL/min (ref >60)
GFR calc non Af Amer: >60 mL/min (ref >60)
Glucose, Bld: 110 mg/dL — ABNORMAL HIGH (ref 70–99)
Potassium: 3.8 mmol/L (ref 3.5–5.1)
Sodium: 141 mmol/L (ref 135–145)
Total Bilirubin: 0.6 mg/dL (ref 0.3–1.2)
Total Protein: 7.2 g/dL (ref 6.5–8.1)

## 2019-07-06 LAB — CBC WITH DIFFERENTIAL/PLATELET
Abs Immature Granulocytes: 0.01 10*3/uL (ref 0.00–0.07)
Basophils Absolute: 0 10*3/uL (ref 0.0–0.1)
Basophils Relative: 0 %
Eosinophils Absolute: 0.1 10*3/uL (ref 0.0–0.5)
Eosinophils Relative: 1 %
HCT: 41.5 % (ref 39.0–52.0)
Hemoglobin: 14 g/dL (ref 13.0–17.0)
Immature Granulocytes: 0 %
Lymphocytes Relative: 39 %
Lymphs Abs: 2.1 10*3/uL (ref 0.7–4.0)
MCH: 30.2 pg (ref 26.0–34.0)
MCHC: 33.7 g/dL (ref 30.0–36.0)
MCV: 89.6 fL (ref 80.0–100.0)
Monocytes Absolute: 0.5 10*3/uL (ref 0.1–1.0)
Monocytes Relative: 9 %
Neutro Abs: 2.7 10*3/uL (ref 1.7–7.7)
Neutrophils Relative %: 51 %
Platelets: 230 10*3/uL (ref 150–400)
RBC: 4.63 MIL/uL (ref 4.22–5.81)
RDW: 12.9 % (ref 11.5–15.5)
WBC: 5.4 10*3/uL (ref 4.0–10.5)
nRBC: 0 % (ref 0.0–0.2)

## 2019-07-06 LAB — URINALYSIS, MICROSCOPIC (REFLEX): RBC / HPF: >50 RBC/hpf (ref 0–5)

## 2019-07-06 MED ORDER — CEFDINIR 300 MG PO CAPS: 300.0000 mg | ORAL_CAPSULE | Freq: Two times a day (BID) | ORAL | 0 refills | Status: DC

## 2019-07-06 MED ORDER — ONDANSETRON HCL 4 MG/2ML IJ SOLN
4.0000 mg | Freq: Once | INTRAMUSCULAR | Status: AC
  Administered 2019-07-06: 03:00:00 4 mg via INTRAVENOUS
  Filled 2019-07-06: qty 2

## 2019-07-06 MED ORDER — ONDANSETRON 4 MG PO TBDP: 4.0000 mg | ORAL_TABLET | Freq: Four times a day (QID) | ORAL | 0 refills | Status: DC | PRN

## 2019-07-06 MED ORDER — SODIUM CHLORIDE 0.9 % IV BOLUS (SEPSIS)
1000.0000 mL | Freq: Once | INTRAVENOUS | Status: AC
  Administered 2019-07-06: 1000 mL via INTRAVENOUS

## 2019-07-06 MED ORDER — KETOROLAC TROMETHAMINE 30 MG/ML IJ SOLN
30.0000 mg | Freq: Once | INTRAMUSCULAR | Status: AC
  Administered 2019-07-06: 03:00:00 30 mg via INTRAVENOUS
  Filled 2019-07-06: qty 1

## 2019-07-06 MED ORDER — CEFDINIR 300 MG PO CAPS: 300.0000 mg | ORAL_CAPSULE | Freq: Two times a day (BID) | ORAL | 0 refills | Status: AC

## 2019-07-06 MED ORDER — SODIUM CHLORIDE 0.9 % IV SOLN
1.0000 g | Freq: Once | INTRAVENOUS | Status: AC
  Administered 2019-07-06: 1 g via INTRAVENOUS
  Filled 2019-07-06: qty 10

## 2019-07-06 MED ORDER — ONDANSETRON 4 MG PO TBDP: 4.0000 mg | ORAL_TABLET | Freq: Four times a day (QID) | ORAL | 0 refills | Status: AC | PRN

## 2019-07-06 NOTE — ED Notes (Signed)
Notified lab to add on urine culture. Reports no relief from pain so far.

## 2019-07-06 NOTE — ED Triage Notes (Addendum)
C/o right lower back pain and right lower abd pain that started one hour ago. States he took ibuprofen pta. Describes as sharp and states pain is constant. Denies history of kidney stones. Denies any fevers. Denies any injury to his back. Denies any urinary symptoms.

## 2019-07-06 NOTE — Discharge Instructions (Addendum)
Your CT scan showed signs of inflammation around the right kidney which could be secondary to an early kidney infection versus a recently passed kidney stone.  No kidney stones were seen on your CT scan today.  I feel you are safe to be discharged home.  Please take your antibiotics until complete.  Your CT scan also showed constipation.  You may use MiraLAX over-the-counter 1-2 times a day and/or Colace 100 mg tablets twice daily to help with bowel movements.  Constipation can contribute to your right-sided abdominal pain.  You may alternate Tylenol 1000 mg every 6 hours as needed for pain, fever and Ibuprofen 800 mg every 8 hours as needed for pain, fever.  Please take Ibuprofen with food.  Do not take more than 4000 mg of Tylenol (acetaminophen) in a 24 hour period.   Steps to find a Primary Care Provider (PCP):  Call [phone] or [phone] to access "[facility]."  2.  You may also go on the [facility] website at [email or web address]  3.  [facility] also frequently accepts new patients.  [facility] [phone]  4.  There are also multiple [facility], [facility] and [facility] practices throughout the [place] that are frequently accepting new patients. You may find a clinic that is close to your home and contact them.  [email or web address] [phone]  [facility] [email or web address]  [email or web address] [place] [email or web address] [phone]  5.  Local Health Departments also can provide primary care services.  [facility]  [place] [address] [phone]  [facility] [place] [address] [place] [facility] [phone]

## 2019-07-06 NOTE — ED Provider Notes (Signed)
TIME SEEN: 3:05 AM  CHIEF COMPLAINT: Right flank pain, nausea  HPI: Patient is a 28 year old male with no significant past medical history presents to the emergency department with right flank pain that started suddenly when he got up to use the bathroom tonight.  Has had nausea and spitting but no vomiting.  No diarrhea, bloody stools or melena.  No dysuria, hematuria, penile discharge.  No testicular pain or swelling.  No injury to the back.  Pain is not worse with movement or palpation.  No numbness, tingling or focal weakness.  No bowel or bladder incontinence.  No history of kidney stones.  No previous abdominal surgery.  States girlfriend drove him here to the emergency department.  ROS: See HPI Constitutional: no fever  Eyes: no drainage  ENT: no runny nose   Cardiovascular:  no chest pain  Resp: no SOB  GI: + Nausea, no vomiting GU: no dysuria Integumentary: no rash  Allergy: no hives  Musculoskeletal: no leg swelling  Neurological: no slurred speech ROS otherwise negative  PAST MEDICAL HISTORY/PAST SURGICAL HISTORY:  History reviewed. No pertinent past medical history.  MEDICATIONS:  Prior to Admission medications   Medication Sig Start Date End Date Taking? Authorizing Provider  cyclobenzaprine (FLEXERIL) 5 MG tablet Take 1 tablet (5 mg total) by mouth 2 (two) times daily as needed. 06/09/18   Horton, Mayer Masker, MD  ibuprofen (ADVIL,MOTRIN) 800 MG tablet Take 1 tablet (800 mg total) by mouth 3 (three) times daily. 01/27/17   Elson Areas, PA-C  polyethylene glycol (MIRALAX / GLYCOLAX) packet Take 17 g by mouth daily. 12/15/17   Khatri, Hillary Bow, PA-C    ALLERGIES:  No Known Allergies  SOCIAL HISTORY:  Social History   Tobacco Use  . Smoking status: Never Smoker  . Smokeless tobacco: Never Used  Substance Use Topics  . Alcohol use: Yes    Comment: occ    FAMILY HISTORY: No family history on file.  EXAM: BP (!) 156/100   Pulse 65   Temp 99.1 F (37.3 C)   Ht  6\' 3"  (1.905 m)   Wt 114.8 kg   SpO2 97%   BMI 31.62 kg/m  CONSTITUTIONAL: Alert and oriented and responds appropriately to questions. Well-appearing; well-nourished HEAD: Normocephalic EYES: Conjunctivae clear, pupils appear equal, EOM appear intact ENT: normal nose; moist mucous membranes NECK: Supple, normal ROM CARD: RRR; S1 and S2 appreciated; no murmurs, no clicks, no rubs, no gallops RESP: Normal chest excursion without splinting or tachypnea; breath sounds clear and equal bilaterally; no wheezes, no rhonchi, no rales, no hypoxia or respiratory distress, speaking full sentences ABD/GI: Normal bowel sounds; non-distended; soft, very minimally tender in the right lower abdomen, no rebound, no guarding, no peritoneal signs, no hepatosplenomegaly BACK:  The back appears normal, no midline spinal tenderness or step-off or deformity, no redness or warmth, no ecchymosis or swelling, no tenderness to palpation EXT: Normal ROM in all joints; no deformity noted, no edema; no cyanosis SKIN: Normal color for age and race; warm; no rash on exposed skin NEURO: Moves all extremities equally PSYCH: The patient's mood and manner are appropriate.   MEDICAL DECISION MAKING: Patient here with sudden onset right flank pain into the right lower quadrant.  He has had nausea.  Suspect kidney stone.  Differential also includes pyelonephritis, UTI.  He has no significant abdominal tenderness on exam.  Appendicitis is less likely.  No GU symptoms otherwise.  Will obtain CT abdomen pelvis, labs, urine and give Toradol, Zofran,  IV fluids.  ED PROGRESS: Patient reports feeling much better after Toradol and Zofran.  CT shows mild right perinephric and periureteral stranding without ureteral or bladder calculi.  Could be secondary to mild pyelonephritis versus passed stone.  Discussed these findings with patient.  Urine does show blood and many bacteria.  We will send urine culture.  Will treat with Rocephin for  possible infectious etiology but given he is well-appearing, I feel it be reasonable to discharge him home with oral antibiotics and he agrees.  I do not feel he needs emergent urologic evaluation.  Discussed return precautions.  Patient comfortable with plan.   At this time, I do not feel there is any life-threatening condition present. I have reviewed, interpreted and discussed all results (EKG, imaging, lab, urine as appropriate) and exam findings with patient/family. I have reviewed nursing notes and appropriate previous records.  I feel the patient is safe to be discharged home without further emergent workup and can continue workup as an outpatient as needed. Discussed usual and customary return precautions. Patient/family verbalize understanding and are comfortable with this plan.  Outpatient follow-up has been provided as needed. All questions have been answered.    Daniel Waller was evaluated in Emergency Department on 07/06/2019 for the symptoms described in the history of present illness. He was evaluated in the context of the global COVID-19 pandemic, which necessitated consideration that the patient might be at risk for infection with the SARS-CoV-2 virus that causes COVID-19. Institutional protocols and algorithms that pertain to the evaluation of patients at risk for COVID-19 are in a state of rapid change based on information released by regulatory bodies including the CDC and federal and state organizations. These policies and algorithms were followed during the patient's care in the ED.  Patient was seen wearing N95, face shield, gloves.    Jereld Presti, Delice Bison, DO 07/06/19 [phone]

## 2019-07-07 LAB — URINE CULTURE: Culture: NO GROWTH

## 2020-02-11 IMAGING — CT CT RENAL STONE PROTOCOL
2 of 3 series · 15 of 46 positions shown, 17 images · non-contrast
Comparison: None.

CLINICAL DATA: Right flank pain

EXAM:
CT ABDOMEN AND PELVIS WITHOUT CONTRAST
TECHNIQUE: Multidetector CT imaging of the abdomen and pelvis was performed
following the standard protocol without IV contrast.

[Series 2: axial st · axial · 0.80mm/px · z∈[-602,-162]mm · 12 of 102 slices shown, 14 images]
[im 7/102  soft-tissue]
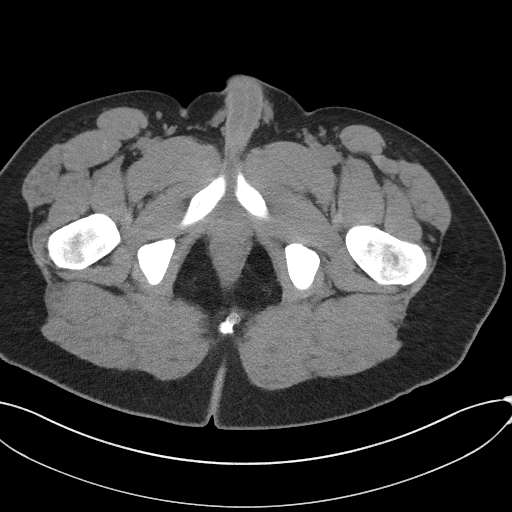
[im 7/102  bone]
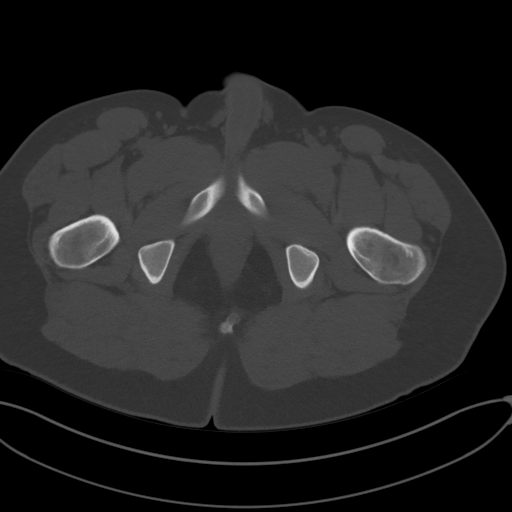
[im 14/102  soft-tissue]
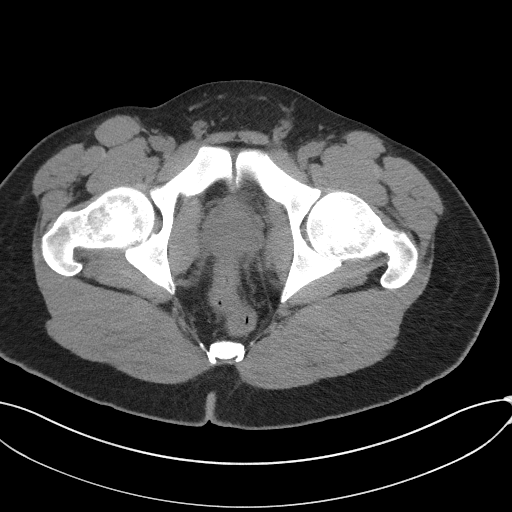
[im 23/102  soft-tissue]
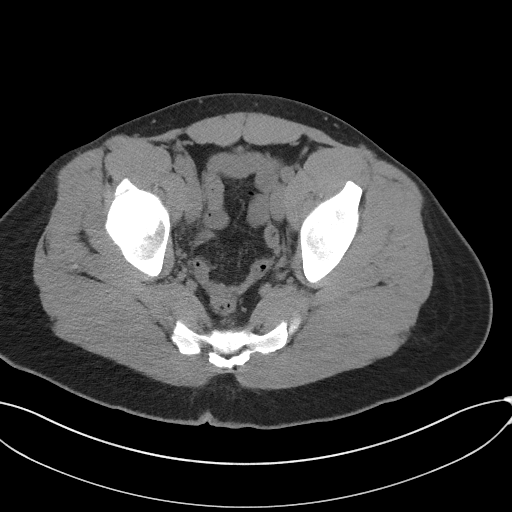
[im 30/102  soft-tissue]
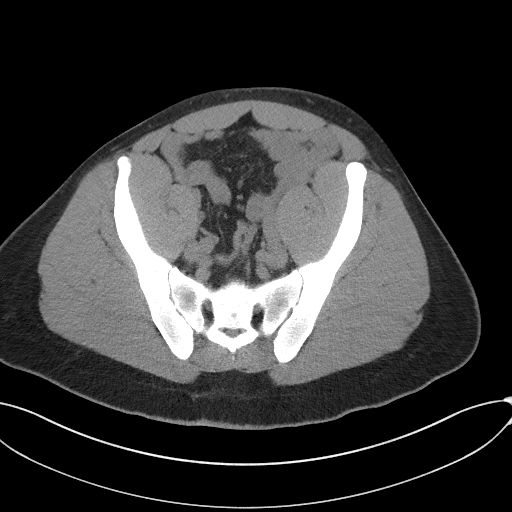
[im 40/102  soft-tissue]
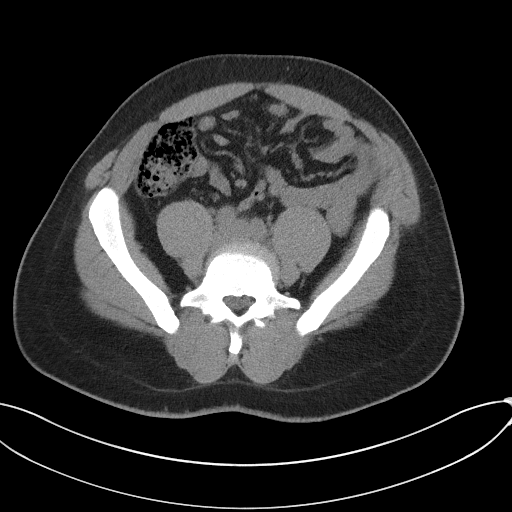
[im 46/102  soft-tissue]
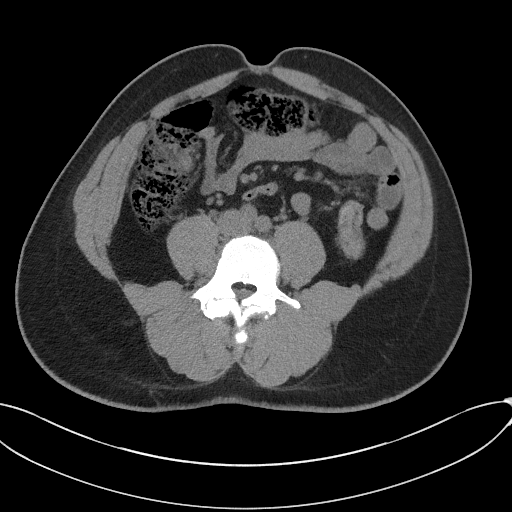
[im 56/102  soft-tissue]
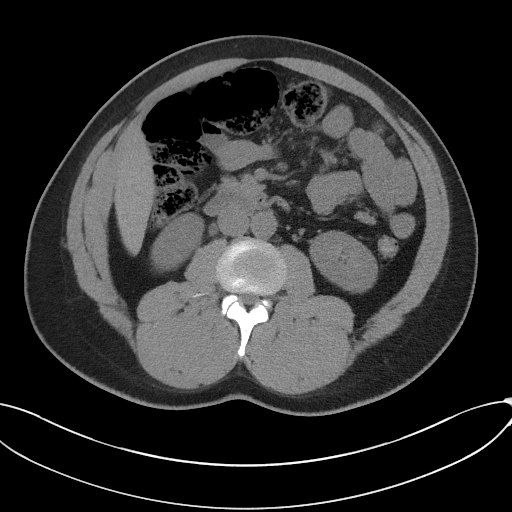
[im 62/102  soft-tissue]
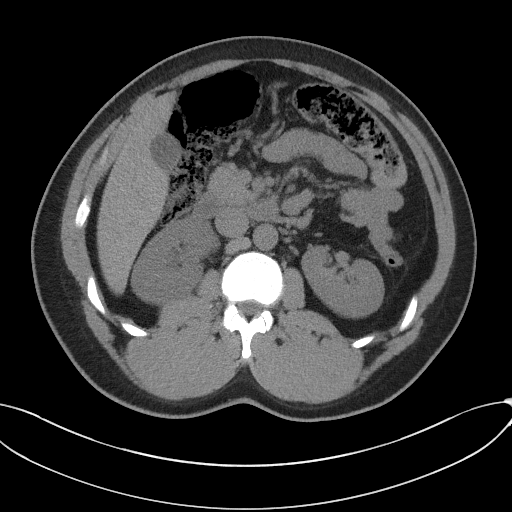
[im 72/102  soft-tissue]
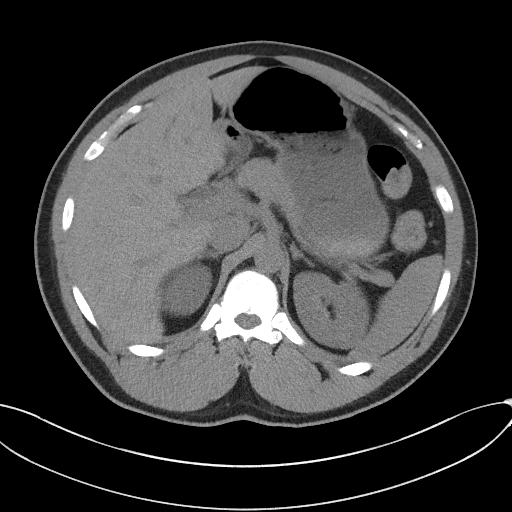
[im 72/102  bone]
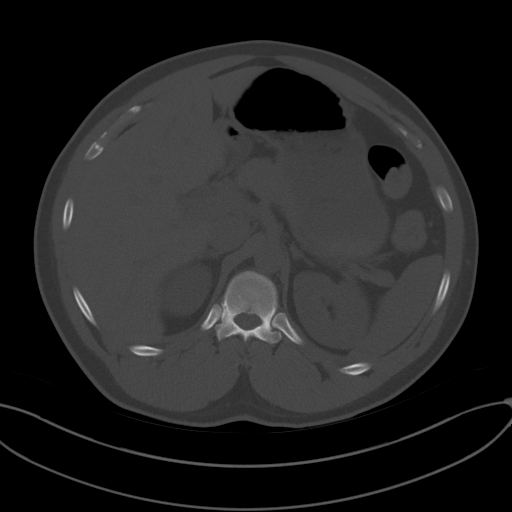
[im 79/102  soft-tissue]
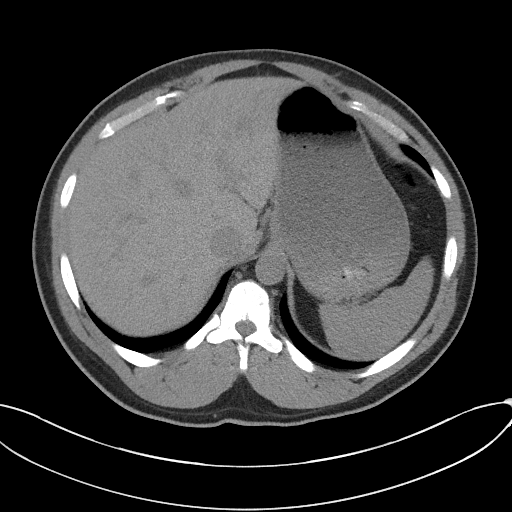
[im 88/102  soft-tissue]
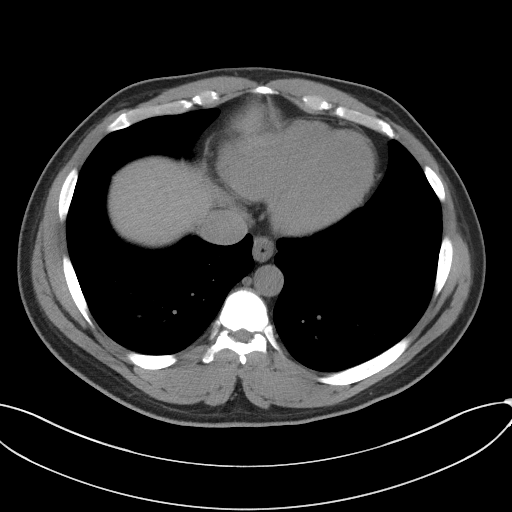
[im 95/102  soft-tissue]
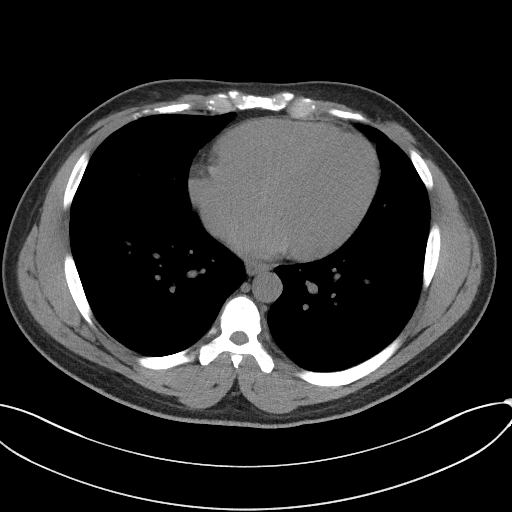

[Series 4: coronal st · coronal · 0.99mm/px · 3 of 101 slices shown]
[im 34/101  soft-tissue]
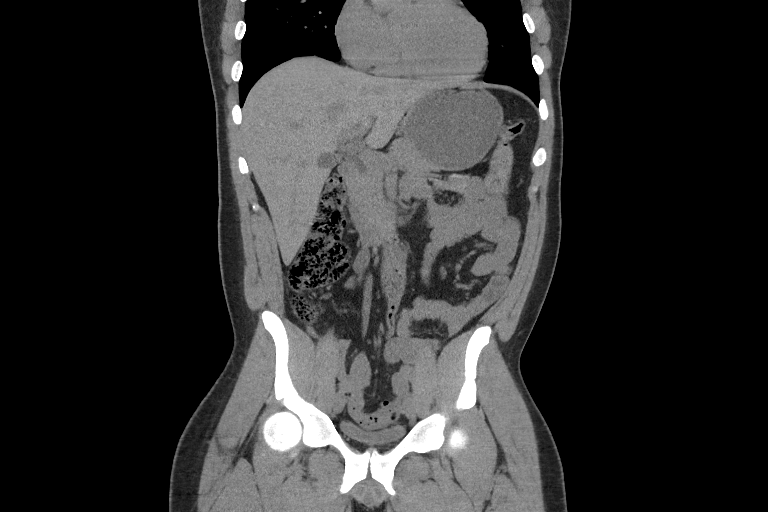
[im 45/101  soft-tissue]
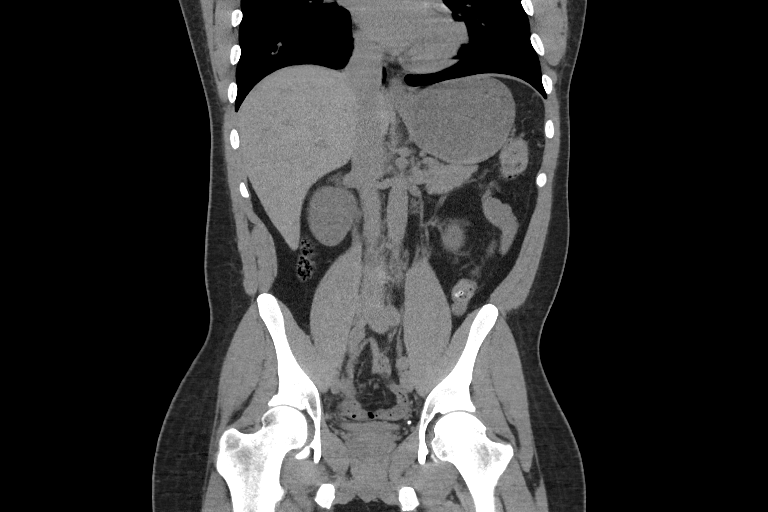
[im 56/101  soft-tissue]
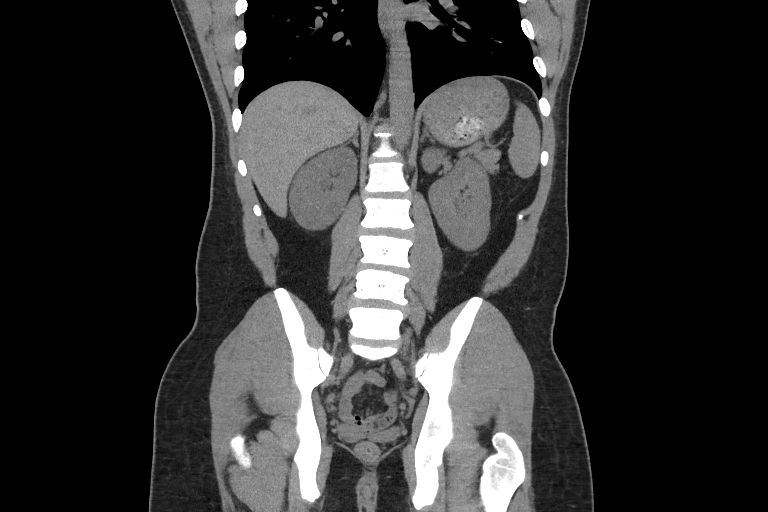

[15 of 46 positions shown; findings below may reference images not displayed]

FINDINGS: Lower chest: The visualized heart size within normal limits. No
pericardial fluid/thickening.

No hiatal hernia.

The visualized portions of the lungs are clear.

Hepatobiliary: Although limited due to the lack of intravenous
contrast, normal in appearance without gross focal abnormality. No
evidence of calcified gallstones or biliary ductal dilatation.

Pancreas:  Unremarkable.  No surrounding inflammatory changes.

Spleen: Normal in size. Although limited due to the lack of
intravenous contrast, normal in appearance.

Adrenals/Urinary Tract: Both adrenal glands appear normal. Punctate
calcifications seen within the lower pole of the right kidney. There
is mild right perinephric and proximal periureteral stranding.
However no ureteral or bladder calculi are noted. No left-sided
calculi are noted.

Stomach/Bowel: The stomach, small bowel, and colon are normal in
appearance. No inflammatory changes or obstructive findings. There
is a moderate to large amount of right colonic stool. The appendix
is unremarkable.

Vascular/Lymphatic: There are no enlarged abdominal or pelvic lymph
nodes. No significant gross vascular findings are present.

Reproductive: The prostate is unremarkable.

Other: No evidence of abdominal wall mass or hernia.

Musculoskeletal: No acute or significant osseous findings. Chronic
bilateral pars defects are seen at L4 with a minimal anterolisthesis
of L4 on L5.
IMPRESSION: mild right perinephric and periureteral stranding without ureteral
or bladder calculi. This could be due to recently passed stone
versus mild pyelonephritis.

Moderate to large amount of right colonic stool.

## 2020-11-27 ENCOUNTER — Encounter (HOSPITAL_BASED_OUTPATIENT_CLINIC_OR_DEPARTMENT_OTHER): Payer: Self-pay | Admitting: Emergency Medicine

## 2020-11-27 ENCOUNTER — Other Ambulatory Visit: Payer: Self-pay

## 2020-11-27 ENCOUNTER — Emergency Department (HOSPITAL_BASED_OUTPATIENT_CLINIC_OR_DEPARTMENT_OTHER)
Admission: EM | Admit: 2020-11-27 | Discharge: 2020-11-27 | Disposition: A | Discharge status: Home / Self Care | Payer: BC Managed Care – PPO | Attending: Emergency Medicine | Admitting: Emergency Medicine

## 2020-11-27 DIAGNOSIS — K0889 Other specified disorders of teeth and supporting structures: Secondary | ICD-10-CM | POA: Diagnosis present

## 2020-11-27 DIAGNOSIS — K029 Dental caries, unspecified: Secondary | ICD-10-CM | POA: Diagnosis not present

## 2020-11-27 MED ORDER — PENICILLIN V POTASSIUM 250 MG PO TABS
500.0000 mg | ORAL_TABLET | Freq: Once | ORAL | Status: AC
  Administered 2020-11-27: 500 mg via ORAL
  Filled 2020-11-27: qty 2

## 2020-11-27 MED ORDER — NAPROXEN 375 MG PO TABS: 375.0000 mg | ORAL_TABLET | Freq: Two times a day (BID) | ORAL | 0 refills | Status: AC

## 2020-11-27 MED ORDER — LIDOCAINE VISCOUS HCL 2 % MT SOLN
15.0000 mL | Freq: Once | OROMUCOSAL | Status: AC
  Administered 2020-11-27: 15 mL via OROMUCOSAL
  Filled 2020-11-27: qty 15

## 2020-11-27 MED ORDER — PENICILLIN V POTASSIUM 500 MG PO TABS: 500.0000 mg | ORAL_TABLET | Freq: Four times a day (QID) | ORAL | 0 refills | Status: AC

## 2020-11-27 NOTE — ED Triage Notes (Signed)
Patient arrived via POV c/o dental abscess on lower left side x 2 days. Patient is AO x 4, VS WDL, normal gait.

## 2020-11-27 NOTE — ED Provider Notes (Signed)
[facility] EMERGENCY DEPARTMENT Provider Note   CSN: [id number] Arrival date & time: 11/27/20  0036     History Chief Complaint  Patient presents with  . Abscess    Daniel Waller is a 30 y.o. male.  The history is provided by the spouse.  Dental Pain Location:  Lower Lower teeth location:  19/LL 1st molar Quality:  Aching Severity:  Severe Onset quality:  Gradual Timing:  Constant Progression:  Unchanged Chronicity:  New Context: poor dentition   Previous work-up:  Dental exam Relieved by:  Nothing Worsened by:  Nothing Ineffective treatments:  Acetaminophen Associated symptoms: no congestion, no difficulty swallowing, no drooling, no facial pain, no facial swelling and no fever   Risk factors: no alcohol problem        History reviewed. No pertinent past medical history.  There are no problems to display for this patient.   History reviewed. No pertinent surgical history.     History reviewed. No pertinent family history.  Social History   Tobacco Use  . Smoking status: Never Smoker  . Smokeless tobacco: Never Used  Vaping Use  . Vaping Use: Never used  Substance Use Topics  . Alcohol use: Yes    Comment: occ  . Drug use: No    Home Medications Prior to Admission medications   Medication Sig Start Date End Date Taking? Authorizing Provider  naproxen (NAPROSYN) 375 MG tablet Take 1 tablet (375 mg total) by mouth 2 (two) times daily with a meal. 11/27/20  Yes Elfie Costanza, MD  penicillin v potassium (VEETID) 500 MG tablet Take 1 tablet (500 mg total) by mouth 4 (four) times daily for 7 days. 11/27/20 12/04/20 Yes Quintin Hjort, MD  cefdinir (OMNICEF) 300 MG capsule Take 1 capsule (300 mg total) by mouth 2 (two) times daily. 07/06/19   Ward, Layla Maw, DO  cyclobenzaprine (FLEXERIL) 5 MG tablet Take 1 tablet (5 mg total) by mouth 2 (two) times daily as needed. 06/09/18   Horton, Mayer Masker, MD  ibuprofen (ADVIL,MOTRIN) 800 MG tablet Take  1 tablet (800 mg total) by mouth 3 (three) times daily. 01/27/17   Elson Areas, PA-C  ondansetron (ZOFRAN ODT) 4 MG disintegrating tablet Take 1 tablet (4 mg total) by mouth every 6 (six) hours as needed for nausea or vomiting. 07/06/19   Ward, Layla Maw, DO  polyethylene glycol (MIRALAX / GLYCOLAX) packet Take 17 g by mouth daily. 12/15/17   Dietrich Pates, PA-C    Allergies    Patient has no known allergies.  Review of Systems   Review of Systems  Constitutional: Negative for fever.  HENT: Negative for congestion, drooling and facial swelling.   Eyes: Negative for visual disturbance.  Respiratory: Negative for shortness of breath.   Cardiovascular: Negative for leg swelling.  Gastrointestinal: Negative for vomiting.  Genitourinary: Negative for difficulty urinating.  Musculoskeletal: Negative for neck stiffness.  Skin: Negative for rash.  Neurological: Negative for facial asymmetry.  Psychiatric/Behavioral: Negative for agitation.  All other systems reviewed and are negative.   Physical Exam Updated Vital Signs BP (!) 135/91 (BP Location: Right Arm)   Pulse (!) 56   Temp 98.9 F (37.2 C) (Oral)   Resp 17   Ht 6\' 3"  (1.905 m)   Wt 113.4 kg   SpO2 97%   BMI 31.25 kg/m   Physical Exam Vitals and nursing note reviewed.  Constitutional:      General: He is not in acute distress.    Appearance:  Normal appearance.  HENT:     Head: Normocephalic and atraumatic.     Nose: Nose normal.     Mouth/Throat:     Mouth: Mucous membranes are moist.     Comments: Dental caries  Eyes:     Conjunctiva/sclera: Conjunctivae normal.     Pupils: Pupils are equal, round, and reactive to light.  Cardiovascular:     Rate and Rhythm: Normal rate and regular rhythm.     Pulses: Normal pulses.     Heart sounds: Normal heart sounds.  Pulmonary:     Effort: Pulmonary effort is normal.     Breath sounds: Normal breath sounds.  Abdominal:     General: Abdomen is flat. Bowel sounds are  normal.     Tenderness: There is no abdominal tenderness.  Musculoskeletal:        General: Normal range of motion.     Cervical back: Normal range of motion and neck supple.  Skin:    General: Skin is warm and dry.     Capillary Refill: Capillary refill takes less than 2 seconds.  Neurological:     General: No focal deficit present.     Mental Status: He is alert.     Deep Tendon Reflexes: Reflexes normal.  Psychiatric:        Mood and Affect: Mood normal.        Behavior: Behavior normal.     ED Results / Procedures / Treatments   Labs (all labs ordered are listed, but only abnormal results are displayed) Labs Reviewed - No data to display  EKG None  Radiology No results found.  Procedures Procedures   Medications Ordered in ED Medications  lidocaine (XYLOCAINE) 2 % viscous mouth solution 15 mL (has no administration in time range)  penicillin v potassium (VEETID) tablet 500 mg (500 mg Oral Given 11/27/20 4098)    ED Course  I have reviewed the triage vital signs and the nursing notes.  Pertinent labs & imaging results that were available during my care of the patient were reviewed by me and considered in my medical decision making (see chart for details).    Will treat with an antibiotic.  No swelling no indication for imaging.  Stable for discharge with follow up with dentist this week.    Caliber Landess was evaluated in Emergency Department on 11/27/2020 for the symptoms described in the history of present illness. He was evaluated in the context of the global COVID-19 pandemic, which necessitated consideration that the patient might be at risk for infection with the SARS-CoV-2 virus that causes COVID-19. Institutional protocols and algorithms that pertain to the evaluation of patients at risk for COVID-19 are in a state of rapid change based on information released by regulatory bodies including the CDC and federal and state organizations. These policies and  algorithms were followed during the patient's care in the ED.  Final Clinical Impression(s) / ED Diagnoses Final diagnoses:  Pain, dental   Return for intractable cough, coughing up blood, fevers >100.4 unrelieved by medication, shortness of breath, intractable vomiting, chest pain, shortness of breath, weakness, numbness, changes in speech, facial asymmetry, abdominal pain, passing out, Inability to tolerate liquids or food, cough, altered mental status or any concerns. No signs of systemic illness or infection. The patient is nontoxic-appearing on exam and vital signs are within normal limits.  I have reviewed the triage vital signs and the nursing notes. Pertinent labs & imaging results that were available during my care of  the patient were reviewed by me and considered in my medical decision making (see chart for details). After history, exam, and medical workup I feel the patient has been appropriately medically screened and is safe for discharge home. Pertinent diagnoses were discussed with the patient. Patient was given return precautions. Rx / DC Orders ED Discharge Orders         Ordered    penicillin v potassium (VEETID) 500 MG tablet  4 times daily        11/27/20 0054    naproxen (NAPROSYN) 375 MG tablet  2 times daily with meals        11/27/20 0054           Kalanie Fewell, MD 11/27/20 0569

## 2021-04-05 ENCOUNTER — Emergency Department (HOSPITAL_BASED_OUTPATIENT_CLINIC_OR_DEPARTMENT_OTHER)
Admission: EM | Admit: 2021-04-05 | Discharge: 2021-04-05 | Disposition: A | Discharge status: Home / Self Care | Payer: Managed Care, Other (non HMO) | Attending: Emergency Medicine | Admitting: Emergency Medicine

## 2021-04-05 ENCOUNTER — Encounter (HOSPITAL_BASED_OUTPATIENT_CLINIC_OR_DEPARTMENT_OTHER): Payer: Self-pay

## 2021-04-05 ENCOUNTER — Other Ambulatory Visit: Payer: Self-pay

## 2021-04-05 DIAGNOSIS — J069 Acute upper respiratory infection, unspecified: Secondary | ICD-10-CM | POA: Insufficient documentation

## 2021-04-05 DIAGNOSIS — R059 Cough, unspecified: Secondary | ICD-10-CM | POA: Diagnosis present

## 2021-04-05 MED ORDER — BENZONATATE 100 MG PO CAPS: 100.0000 mg | ORAL_CAPSULE | Freq: Three times a day (TID) | ORAL | 0 refills | Status: AC | PRN

## 2021-04-05 NOTE — ED Triage Notes (Signed)
Pt c/o flu like sx x 5 days-NAD-steady gait 

## 2021-04-05 NOTE — ED Notes (Signed)
Pt discharged to home. Discharge instructions have been discussed with patient and/or family members. Pt verbally acknowledges understanding d/c instructions, and endorses comprehension to checkout at registration before leaving.  °

## 2021-04-05 NOTE — ED Provider Notes (Signed)
[facility] EMERGENCY DEPARTMENT Provider Note   CSN: [id number] Arrival date & time: 04/05/21  1312     History Chief Complaint  Patient presents with  . Cough    Daniel Waller is a 30 y.o. male.  HPI Patient is a 30 year old male who presents to the emergency department with cough, headache, rhinorrhea, sore throat.  Patient states his symptoms started about 5 days ago.  They were initially improving with OTC medications but his symptoms began worsening this morning and he came to the emergency department for further evaluation.  Denies any chest pain, shortness of breath, nausea, vomiting, diarrhea.  States he is vaccinated for COVID-19 x1 and also notes a previous COVID-19 infection in early 2022.    History reviewed. No pertinent past medical history.  There are no problems to display for this patient.   History reviewed. No pertinent surgical history.     No family history on file.  Social History   Tobacco Use  . Smoking status: Never  . Smokeless tobacco: Never  Vaping Use  . Vaping Use: Never used  Substance Use Topics  . Alcohol use: Yes    Comment: occ  . Drug use: No    Home Medications Prior to Admission medications   Medication Sig Start Date End Date Taking? Authorizing Provider  benzonatate (TESSALON) 100 MG capsule Take 1 capsule (100 mg total) by mouth 3 (three) times daily as needed for cough. 04/05/21  Yes Placido Sou, PA-C  cefdinir (OMNICEF) 300 MG capsule Take 1 capsule (300 mg total) by mouth 2 (two) times daily. 07/06/19   Ward, Layla Maw, DO  cyclobenzaprine (FLEXERIL) 5 MG tablet Take 1 tablet (5 mg total) by mouth 2 (two) times daily as needed. 06/09/18   Horton, Mayer Masker, MD  ibuprofen (ADVIL,MOTRIN) 800 MG tablet Take 1 tablet (800 mg total) by mouth 3 (three) times daily. 01/27/17   Elson Areas, PA-C  naproxen (NAPROSYN) 375 MG tablet Take 1 tablet (375 mg total) by mouth 2 (two) times daily with a meal. 11/27/20    Palumbo, April, MD  ondansetron (ZOFRAN ODT) 4 MG disintegrating tablet Take 1 tablet (4 mg total) by mouth every 6 (six) hours as needed for nausea or vomiting. 07/06/19   Ward, Layla Maw, DO  polyethylene glycol (MIRALAX / GLYCOLAX) packet Take 17 g by mouth daily. 12/15/17   Dietrich Pates, PA-C    Allergies    Patient has no known allergies.  Review of Systems   Review of Systems  HENT:  Positive for congestion, rhinorrhea, sinus pressure and sore throat. Negative for ear discharge and ear pain.   Respiratory:  Positive for cough. Negative for shortness of breath.   Cardiovascular:  Negative for chest pain.  Gastrointestinal:  Negative for diarrhea, nausea and vomiting.   Physical Exam Updated Vital Signs BP 126/81 (BP Location: Left Arm)   Pulse 70   Temp 98.4 F (36.9 C) (Oral)   Resp 18   Ht 6\' 3"  (1.905 m)   Wt 112 kg   SpO2 93%   BMI 30.87 kg/m   Physical Exam Vitals and nursing note reviewed.  Constitutional:      General: He is not in acute distress.    Appearance: Normal appearance. He is not ill-appearing, toxic-appearing or diaphoretic.  HENT:     Head: Normocephalic and atraumatic.     Right Ear: Tympanic membrane, ear canal and external ear normal. There is no impacted cerumen.  Left Ear: Tympanic membrane, ear canal and external ear normal. There is no impacted cerumen.     Nose: Congestion present. No rhinorrhea.     Mouth/Throat:     Mouth: Mucous membranes are moist.     Pharynx: Oropharynx is clear. No oropharyngeal exudate or posterior oropharyngeal erythema.     Comments: Uvula midline.  Readily handling secretions.  No erythema or exudates noted in the posterior oropharynx. Eyes:     Extraocular Movements: Extraocular movements intact.  Cardiovascular:     Rate and Rhythm: Normal rate and regular rhythm.     Pulses: Normal pulses.     Heart sounds: Normal heart sounds. No murmur heard.   No friction rub. No gallop.  Pulmonary:     Effort:  Pulmonary effort is normal. No respiratory distress.     Breath sounds: Normal breath sounds. No stridor. No wheezing, rhonchi or rales.  Abdominal:     General: Abdomen is flat.     Palpations: Abdomen is soft.     Tenderness: There is no abdominal tenderness.  Musculoskeletal:        General: Normal range of motion.     Cervical back: Normal range of motion and neck supple. No tenderness.  Skin:    General: Skin is warm and dry.  Neurological:     General: No focal deficit present.     Mental Status: He is alert and oriented to person, place, and time.  Psychiatric:        Mood and Affect: Mood normal.        Behavior: Behavior normal.    ED Results / Procedures / Treatments   Labs (all labs ordered are listed, but only abnormal results are displayed) Labs Reviewed - No data to display  EKG None  Radiology No results found.  Procedures Procedures   Medications Ordered in ED Medications - No data to display  ED Course  I have reviewed the triage vital signs and the nursing notes.  Pertinent labs & imaging results that were available during my care of the patient were reviewed by me and considered in my medical decision making (see chart for details).    MDM Rules/Calculators/A&P                          Patient is a 30 year old male who presents to the emergency department due to what appears to be a viral URI.  Physical exam is extremely reassuring.  Patient has moderate congestion but otherwise a benign physical exam.  No erythema noted in the posterior oropharynx.  Heart is regular rate and rhythm.  Lungs are clear to auscultation bilaterally.  Abdomen is soft and nontender.  Currently afebrile and not tachycardic.  Patient offered COVID-19 testing but declined.  Will prescribe a course of Tessalon Perles.  Recommended continued use of OTC medications for symptomatic management.  We discussed return precautions.  Patient given a work note.  Feel that he is stable  for discharge at this time and he is agreeable.  His questions were answered and he was amicable at the time of discharge.  Final Clinical Impression(s) / ED Diagnoses Final diagnoses:  Viral URI with cough   Rx / DC Orders ED Discharge Orders          Ordered    benzonatate (TESSALON) 100 MG capsule  3 times daily PRN        04/05/21 1427  Placido Sou, PA-C 04/05/21 1432    Milagros Loll, MD 04/07/21 [phone]

## 2021-04-05 NOTE — Discharge Instructions (Signed)
I prescribed you a cough medication called Tessalon Perles.  Please take these up to 3 times a day for your cough.  Please continue to take over-the-counter medications for management of your symptoms.  I would recommend DayQuil/NyQuil.  Also consider hot tea with honey.  I would also recommend Flonase for your congestion.  You can purchase all these medications over-the-counter.  If you develop any new or worsening symptoms please come back to the emergency department.  It was a pleasure to meet you.
